# Patient Record
Sex: Male | Born: 1995 | Race: White | Hispanic: No | Marital: Single | Smoking: Never smoker
Health system: Southern US, Community
[De-identification: ages and names within clinical notes are randomized; demographics above are authoritative.]

---

## 2016-03-15 ENCOUNTER — Emergency Department
Admission: EM | Admit: 2016-03-15 | Discharge: 2016-03-16 | Disposition: A | Discharge status: Home / Self Care | Payer: BLUE CROSS/BLUE SHIELD | Source: Home / Self Care

## 2016-03-15 ENCOUNTER — Emergency Department: Payer: BLUE CROSS/BLUE SHIELD

## 2016-03-15 DIAGNOSIS — Z5321 Procedure and treatment not carried out due to patient leaving prior to being seen by health care provider: Secondary | ICD-10-CM | POA: Insufficient documentation

## 2016-03-15 DIAGNOSIS — Y929 Unspecified place or not applicable: Secondary | ICD-10-CM | POA: Diagnosis not present

## 2016-03-15 DIAGNOSIS — M25572 Pain in left ankle and joints of left foot: Secondary | ICD-10-CM

## 2016-03-15 DIAGNOSIS — X501XXA Overexertion from prolonged static or awkward postures, initial encounter: Secondary | ICD-10-CM | POA: Diagnosis not present

## 2016-03-15 DIAGNOSIS — S93402A Sprain of unspecified ligament of left ankle, initial encounter: Secondary | ICD-10-CM | POA: Diagnosis not present

## 2016-03-15 DIAGNOSIS — Y999 Unspecified external cause status: Secondary | ICD-10-CM | POA: Diagnosis not present

## 2016-03-15 DIAGNOSIS — Y9339 Activity, other involving climbing, rappelling and jumping off: Secondary | ICD-10-CM | POA: Diagnosis not present

## 2016-03-15 DIAGNOSIS — S99912A Unspecified injury of left ankle, initial encounter: Secondary | ICD-10-CM | POA: Diagnosis present

## 2016-03-15 NOTE — ED Triage Notes (Signed)
Patient reports rolled his ankle a week ago.  Patient reports left ankle pain since.

## 2016-03-16 ENCOUNTER — Telehealth: Payer: Self-pay | Admitting: Emergency Medicine

## 2016-03-16 ENCOUNTER — Emergency Department
Admission: EM | Admit: 2016-03-16 | Discharge: 2016-03-16 | Disposition: A | Discharge status: Home / Self Care | Payer: BLUE CROSS/BLUE SHIELD | Attending: Student | Admitting: Student

## 2016-03-16 ENCOUNTER — Encounter: Payer: Self-pay | Admitting: Emergency Medicine

## 2016-03-16 DIAGNOSIS — Y929 Unspecified place or not applicable: Secondary | ICD-10-CM | POA: Insufficient documentation

## 2016-03-16 DIAGNOSIS — Y9339 Activity, other involving climbing, rappelling and jumping off: Secondary | ICD-10-CM | POA: Insufficient documentation

## 2016-03-16 DIAGNOSIS — X501XXA Overexertion from prolonged static or awkward postures, initial encounter: Secondary | ICD-10-CM | POA: Insufficient documentation

## 2016-03-16 DIAGNOSIS — S93402A Sprain of unspecified ligament of left ankle, initial encounter: Secondary | ICD-10-CM | POA: Insufficient documentation

## 2016-03-16 DIAGNOSIS — Y999 Unspecified external cause status: Secondary | ICD-10-CM | POA: Insufficient documentation

## 2016-03-16 NOTE — ED Triage Notes (Signed)
Patient presents to the ED with left ankle pain x 1 week.  Patient states he rolled his ankle jumping over a fence over a week ago on Saturday.  Patient ambulatory to triage with limp.  Patient states, "I kept thinking it would get better but it hasn't."  Patient reports history of ankle sprains.

## 2016-03-16 NOTE — Telephone Encounter (Addendum)
Called patient due to lwot to inquire about condition and follow up plans.  There is not a phone number listed.  I will send a letter as there is a questionable fracture     I was unable to send a letter due to no address listed on chart.

## 2016-03-16 NOTE — ED Notes (Signed)
See triage note  States he rolled his left ankle about 1 week ago while jumping over a fence  Was here yesterday and was x-rayed byt left before getting his results  Ambulates well to treatment room

## 2016-03-16 NOTE — ED Provider Notes (Signed)
[facility] Emergency Department Provider Note  ____________________________________________  Time seen: Approximately 7:58 PM  I have reviewed the triage vital signs and the nursing notes.   HISTORY  Chief Complaint Ankle Pain    HPI Seth Marquez is a 20 y.o. male presents for evaluation of continued pain in his left ankle after spraining a 1 week ago. Is here last night had x-rays in the follow-up today. States no dorsal or foot pain mostly in the left ankle.   History reviewed. No pertinent past medical history.  There are no active problems to display for this patient.   History reviewed. No pertinent surgical history.  Prior to Admission medications   Not on File    Allergies Review of patient's allergies indicates no known allergies.  No family history on file.  Social History Social History  Substance Use Topics  . Smoking status: Never Smoker  . Smokeless tobacco: Never Used  . Alcohol use Yes     Comment: occasionally    Review of Systems Constitutional: No fever/chills Cardiovascular: Denies chest pain. Respiratory: Denies shortness of breath. Musculoskeletal: Positive for left ankle pain. Skin: Negative for rash. Neurological: Negative for headaches, focal weakness or numbness.  10-point ROS otherwise negative.  ____________________________________________   PHYSICAL EXAM:  VITAL SIGNS: ED Triage Vitals  Enc Vitals Group     BP 03/16/16 1743 (!) 148/85     Pulse Rate 03/16/16 1743 97     Resp 03/16/16 1743 16     Temp 03/16/16 1743 97.9 F (36.6 C)     Temp Source 03/16/16 1743 Oral     SpO2 03/16/16 1743 99 %     Weight 03/16/16 1744 170 lb (77.1 kg)     Height 03/16/16 1744 5\' 11"  (1.803 m)     Head Circumference --      Peak Flow --      Pain Score 03/16/16 1744 2     Pain Loc --      Pain Edu? --      Excl. in GC? --     Constitutional: Alert and oriented. Well appearing and in no acute  distress. Musculoskeletal: Left ankle with no obvious edema. Distally neurovascularly intact good capillary refill. Increased pain with extension. Neurologic:  Normal speech and language. No gross focal neurologic deficits are appreciated. No gait instability. Skin:  Skin is warm, dry and intact. No rash noted. Psychiatric: Mood and affect are normal. Speech and behavior are normal.  ____________________________________________   LABS (all labs ordered are listed, but only abnormal results are displayed)  Labs Reviewed - No data to display ____________________________________________  EKG   ____________________________________________  RADIOLOGY  No acute radiological findings. ____________________________________________   PROCEDURES  Procedure(s) performed: None  Critical Care performed: No  ____________________________________________   INITIAL IMPRESSION / ASSESSMENT AND PLAN / ED COURSE  Pertinent labs & imaging results that were available during my care of the patient were reviewed by me and considered in my medical decision making (see chart for details). Review of the [place] CSRS was performed in accordance of the [facility] prior to dispensing any controlled drugs.  Acute left ankle sprain. Reassurance provided to the patient and encouraged continued use of ibuprofen over-the-counter as needed follow-up with orthopedics if continued pain.  Clinical Course    ____________________________________________   FINAL CLINICAL IMPRESSION(S) / ED DIAGNOSES  Final diagnoses:  Left ankle sprain, initial encounter     This chart was dictated using voice recognition software/Dragon. Despite best  efforts to proofread, errors can occur which can change the meaning. Any change was purely unintentional.    Evangeline Dakinharles M Cecillia Menees, PA-C 03/16/16 2000    Gayla DossEryka A Gayle, MD 03/17/16 [phone]

## 2017-03-06 IMAGING — CR DG FOOT COMPLETE 3+V*L*
3 series · 3 of 3 positions shown · non-contrast
Comparison: None.

CLINICAL DATA: Left foot pain and bruising after injury.

EXAM:
LEFT FOOT - COMPLETE 3+ VIEW

[foot ap]
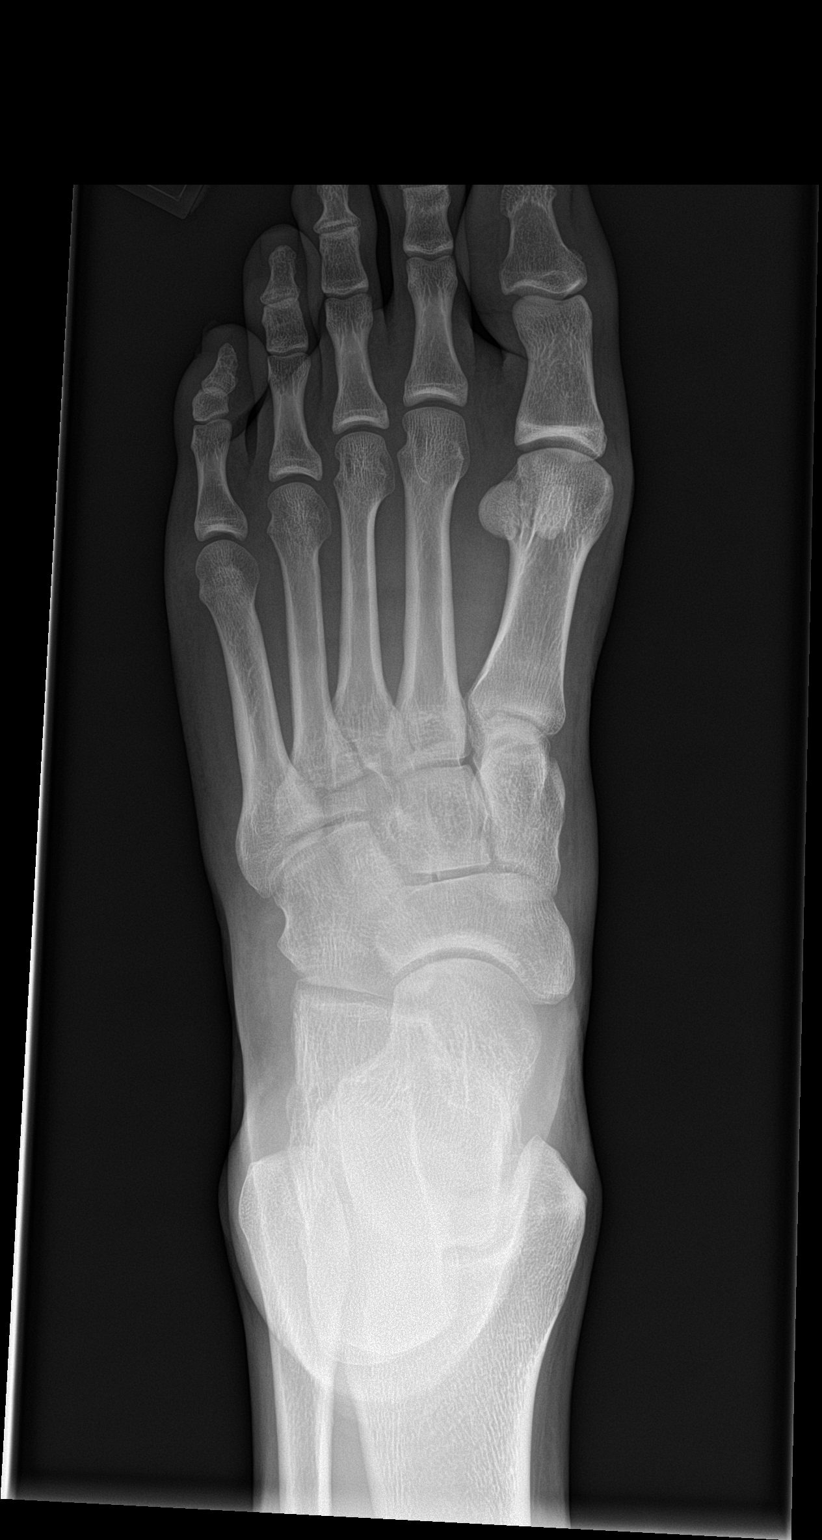

[foot obl]
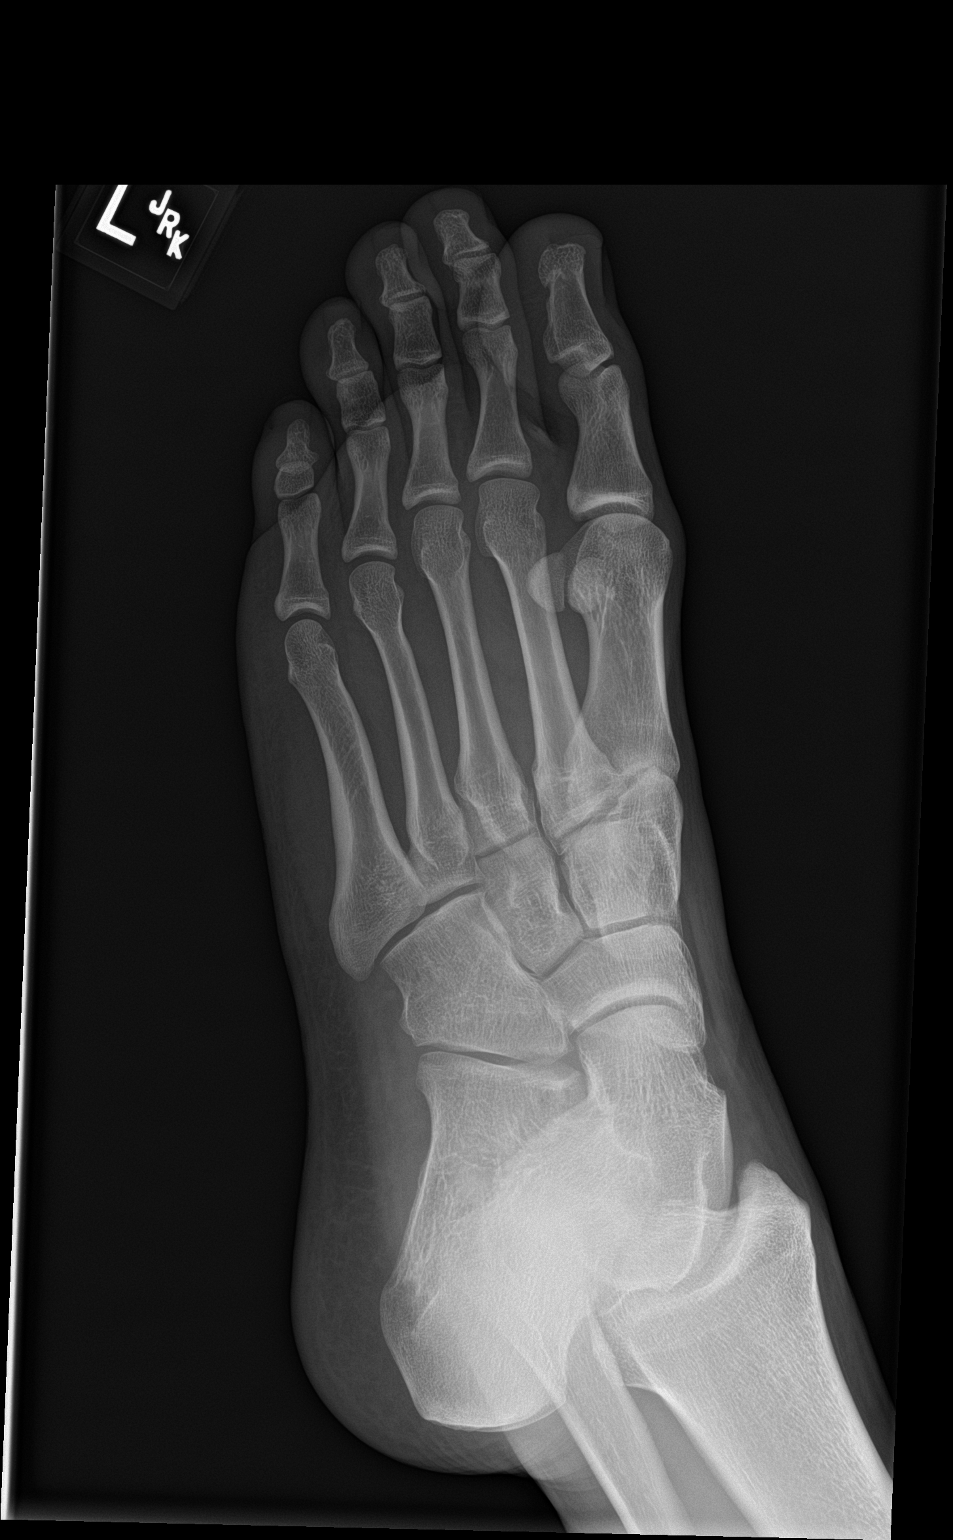

[foot lat]
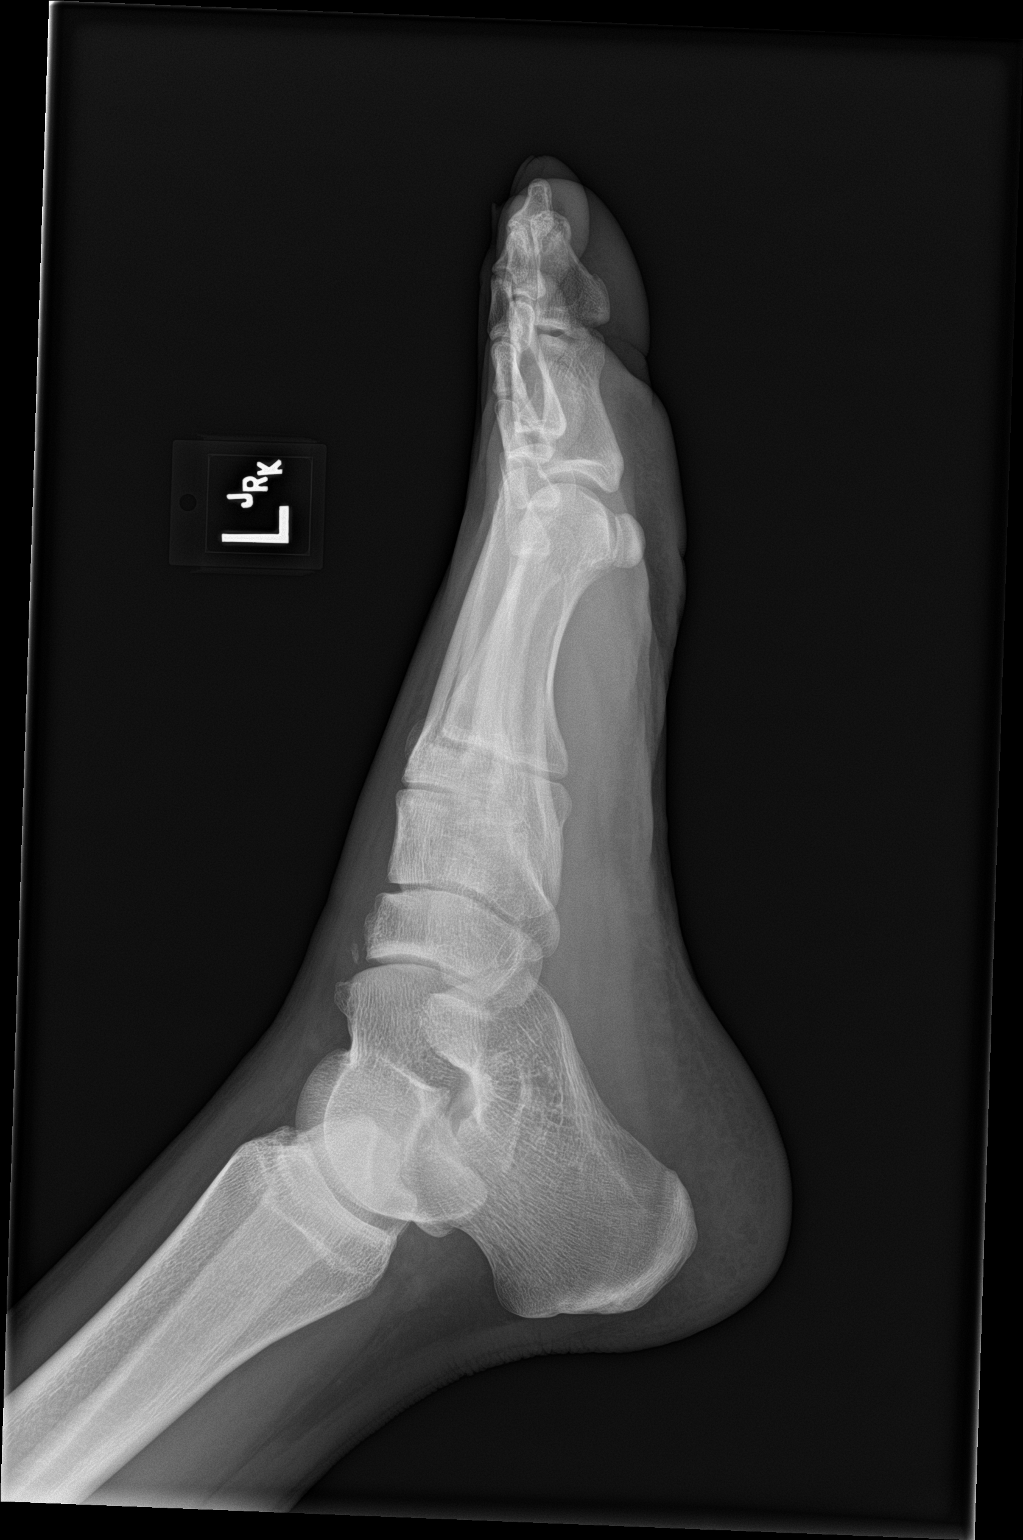

[3 of 3 positions shown; findings below may reference images not displayed]

FINDINGS: Small fragment adjacent to the dorsal navicular appears well
corticated. There is otherwise no evidence fracture. The alignment
and joint spaces are maintained. No definite focal soft tissue edema
overlying the cortical fragment. No radiopaque foreign body.
IMPRESSION: Small osseous fragment adjacent to the dorsal navicular appears well
corticated and is likely sequela of remote prior injury, however if
there is focal pain in this region, acute fracture is considered.
Recommend correlation for point tenderness.

## 2024-04-17 ENCOUNTER — Other Ambulatory Visit (INDEPENDENT_AMBULATORY_CARE_PROVIDER_SITE_OTHER): Payer: PRIVATE HEALTH INSURANCE

## 2024-04-17 DIAGNOSIS — Z23 Encounter for immunization: Secondary | ICD-10-CM | POA: Diagnosis not present

## 2024-07-10 ENCOUNTER — Other Ambulatory Visit: Payer: Self-pay

## 2024-07-10 ENCOUNTER — Ambulatory Visit (INDEPENDENT_AMBULATORY_CARE_PROVIDER_SITE_OTHER): Payer: PRIVATE HEALTH INSURANCE | Admitting: Adult Health

## 2024-07-10 VITALS — BP 130/82 | HR 95 | Temp 97.0°F | Wt 173.0 lb

## 2024-07-10 DIAGNOSIS — H0016 Chalazion left eye, unspecified eyelid: Secondary | ICD-10-CM

## 2024-07-10 DIAGNOSIS — R21 Rash and other nonspecific skin eruption: Secondary | ICD-10-CM | POA: Diagnosis not present

## 2024-07-10 DIAGNOSIS — L731 Pseudofolliculitis barbae: Secondary | ICD-10-CM | POA: Diagnosis not present

## 2024-07-10 MED ORDER — CLOTRIMAZOLE-BETAMETHASONE 1-0.05 % EX CREA: 1.0000 | TOPICAL_CREAM | Freq: Every day | CUTANEOUS | 0 refills | Status: AC

## 2024-07-10 NOTE — Progress Notes (Signed)
 [facility] [address] [place], [place] [address] Phone: [phone] Fax: [phone]   Office Visit Note  Patient Name: Seth Marquez  Date of Apmuy:[id number]  Med Rec number [id number]  Date of Service: 07/10/2024  Patient has no known allergies.  Chief Complaint  Patient presents with   Acute Visit     HPI  Pt here reporting growths on his upper and lower lid of left eye. He reports his lower lids are mildly swollen in the morning Also has a bump in right armpit, that he would like looked at.  He describes it as discoloring. He also reports a rash on his left ankle. He reports the rash has been present since August.    Current Medication:  Outpatient Encounter Medications as of 07/10/2024  Medication Sig   clotrimazole -betamethasone  (LOTRISONE ) cream Apply 1 Application topically daily.   No facility-administered encounter medications on file as of 07/10/2024.      Medical History: No past medical history on file.   Vital Signs: BP 130/82   Pulse 95   Temp (!) 97 F (36.1 C) (Tympanic)   Wt 173 lb (78.5 kg)   SpO2 100%   BMI 24.13 kg/m    Review of Systems  Constitutional:  Negative for chills, fatigue and fever.  Eyes:  Positive for redness.       Eye lesions on lids  Skin:  Positive for rash.    Physical Exam Vitals reviewed.  Constitutional:      Appearance: Normal appearance.  HENT:     Head: Normocephalic.  Neurological:     Mental Status: He is alert.     Assessment/Plan: 1. Chalazion of left eye, unspecified eyelid (Primary) Warm compress, and see eye doctor as discussed.   2. Rash Use Lotrisone  as discussed.  - clotrimazole -betamethasone  (LOTRISONE ) cream; Apply 1 Application topically daily.  Dispense: 30 g; Refill: 0  3. Ingrown hair Discussed to watch and wait.  May use warm compress as discussed.      General Counseling: Seth Marquez verbalizes understanding of the findings of todays visit and agrees with plan of treatment.  I have discussed any further diagnostic evaluation that may be needed or ordered today. We also reviewed his medications today. he has been encouraged to call the office with any questions or concerns that should arise related to todays visit.   No orders of the defined types were placed in this encounter.   Meds ordered this encounter  Medications   clotrimazole -betamethasone  (LOTRISONE ) cream    Sig: Apply 1 Application topically daily.    Dispense:  30 g    Refill:  0    Time spent:15 Minutes Time spent includes review of chart, medications, test results, and follow up plan with the patient.    Seth Marquez AGNP-C Nurse Practitioner
